# Patient Record
Sex: Female | Born: 2000 | Race: White | Hispanic: No | Marital: Single | Smoking: Never smoker
Health system: Southern US, Community
[De-identification: ages and names within clinical notes are randomized; demographics above are authoritative.]

## PROBLEM LIST (undated history)

## (undated) DIAGNOSIS — H209 Unspecified iridocyclitis: Secondary | ICD-10-CM

## (undated) DIAGNOSIS — H35371 Puckering of macula, right eye: Secondary | ICD-10-CM

## (undated) DIAGNOSIS — H4389 Other disorders of vitreous body: Secondary | ICD-10-CM

## (undated) DIAGNOSIS — H35059 Retinal neovascularization, unspecified, unspecified eye: Secondary | ICD-10-CM

## (undated) DIAGNOSIS — H35359 Cystoid macular degeneration, unspecified eye: Secondary | ICD-10-CM

## (undated) DIAGNOSIS — H33109 Unspecified retinoschisis, unspecified eye: Secondary | ICD-10-CM

---
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
## 2021-03-04 ENCOUNTER — Emergency Department (HOSPITAL_COMMUNITY): Payer: BC Managed Care – PPO

## 2021-03-04 ENCOUNTER — Other Ambulatory Visit: Payer: Self-pay

## 2021-03-04 ENCOUNTER — Emergency Department (HOSPITAL_COMMUNITY)
Admission: EM | Admit: 2021-03-04 | Discharge: 2021-03-05 | Disposition: A | Discharge status: Home / Self Care | Payer: BC Managed Care – PPO | Attending: Emergency Medicine | Admitting: Emergency Medicine

## 2021-03-04 ENCOUNTER — Encounter (HOSPITAL_COMMUNITY): Payer: Self-pay | Admitting: Emergency Medicine

## 2021-03-04 DIAGNOSIS — S8992XA Unspecified injury of left lower leg, initial encounter: Secondary | ICD-10-CM | POA: Diagnosis not present

## 2021-03-04 DIAGNOSIS — S3991XA Unspecified injury of abdomen, initial encounter: Secondary | ICD-10-CM | POA: Insufficient documentation

## 2021-03-04 DIAGNOSIS — S3992XA Unspecified injury of lower back, initial encounter: Secondary | ICD-10-CM | POA: Diagnosis not present

## 2021-03-04 DIAGNOSIS — T07XXXA Unspecified multiple injuries, initial encounter: Secondary | ICD-10-CM

## 2021-03-04 MED ORDER — HYDROCODONE-ACETAMINOPHEN 5-325 MG PO TABS
1.0000 | ORAL_TABLET | Freq: Once | ORAL | Status: AC
  Administered 2021-03-04: 23:00:00 1 via ORAL
  Filled 2021-03-04: qty 1
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
## 2021-03-05 ENCOUNTER — Emergency Department (HOSPITAL_COMMUNITY): Payer: BC Managed Care – PPO

## 2021-03-05 DIAGNOSIS — S3991XA Unspecified injury of abdomen, initial encounter: Secondary | ICD-10-CM | POA: Diagnosis not present

## 2021-03-05 LAB — PREGNANCY, URINE: Preg Test, Ur: NEGATIVE

## 2021-03-05 MED ORDER — IOHEXOL 300 MG/ML  SOLN
100.0000 mL | Freq: Once | INTRAMUSCULAR | Status: AC | PRN
  Administered 2021-03-05: 100 mL via INTRAVENOUS

## 2021-03-05 NOTE — Discharge Instructions (Signed)
Apply ice to sore areas for 30 minutes at a time, 4 times a day.  Take either ibuprofen or naproxen as needed for pain.  For additional pain relief, add acetaminophen.  Please note that combining acetaminophen and either ibuprofen or naproxen gives you better pain relief than taking either medication by itself.

## 2021-09-08 ENCOUNTER — Encounter: Payer: Self-pay | Admitting: Internal Medicine

## 2021-10-06 ENCOUNTER — Ambulatory Visit: Payer: BC Managed Care – PPO | Admitting: Gastroenterology

## 2021-10-21 ENCOUNTER — Ambulatory Visit: Payer: BC Managed Care – PPO | Admitting: Gastroenterology

## 2021-11-16 NOTE — Progress Notes (Deleted)
GI Office Note    Referring Provider: Donetta Potts, MD Primary Care Physician:  Donetta Potts, MD  Primary Gastroenterologist:  Chief Complaint   No chief complaint on file.    History of Present Illness   Emily Lewis is a 21 y.o. female presenting today at the request of Dr. Mayford Knife for further evaluation of weight loss.   Labs***        Medications   No current outpatient medications on file.   No current facility-administered medications for this visit.    Allergies   Allergies as of 11/17/2021 - Review Complete 05/24/2018  Allergen Reaction Noted   Zithromax [azithromycin] Hives 08/28/2015    Past Medical History   Past Medical History:  Diagnosis Date   CME (cystoid macular edema)    ERM OD (epiretinal membrane, right eye)    Retinal neovascularization    Retinoschisis    Uveitis    Vitritis     Past Surgical History   No past surgical history on file.  Past Family History   Family History  Problem Relation Age of Onset   Cancer Other    Hypertension Father    Other Father        MIASTERIA GRAVIS   Glaucoma Maternal Grandmother    Mental illness Maternal Grandfather    Other Maternal Grandfather        MIASTERIA GRAVIS    Past Social History   Social History   Socioeconomic History   Marital status: Single    Spouse name: Not on file   Number of children: Not on file   Years of education: Not on file   Highest education level: Not on file  Occupational History   Not on file  Tobacco Use   Smoking status: Never   Smokeless tobacco: Never  Vaping Use   Vaping Use: Never used  Substance and Sexual Activity   Alcohol use: Never   Drug use: Never   Sexual activity: Not on file  Other Topics Concern   Not on file  Social History Narrative   Not on file   Social Determinants of Health   Financial Resource Strain: Not on file  Food Insecurity: Not on file  Transportation Needs: Not on file   Physical Activity: Not on file  Stress: Not on file  Social Connections: Not on file  Intimate Partner Violence: Not on file    Review of Systems   General: Negative for anorexia, weight loss, fever, chills, fatigue, weakness. Eyes: Negative for vision changes.  ENT: Negative for hoarseness, difficulty swallowing , nasal congestion. CV: Negative for chest pain, angina, palpitations, dyspnea on exertion, peripheral edema.  Respiratory: Negative for dyspnea at rest, dyspnea on exertion, cough, sputum, wheezing.  GI: See history of present illness. GU:  Negative for dysuria, hematuria, urinary incontinence, urinary frequency, nocturnal urination.  MS: Negative for joint pain, low back pain.  Derm: Negative for rash or itching.  Neuro: Negative for weakness, abnormal sensation, seizure, frequent headaches, memory loss,  confusion.  Psych: Negative for anxiety, depression, suicidal ideation, hallucinations.  Endo: Negative for unusual weight change.  Heme: Negative for bruising or bleeding. Allergy: Negative for rash or hives.  Physical Exam   There were no vitals taken for this visit.   General: Well-nourished, well-developed in no acute distress.  Head: Normocephalic, atraumatic.   Eyes: Conjunctiva pink, no icterus. Mouth: Oropharyngeal mucosa moist and pink , no lesions erythema or exudate. Neck: Supple without  thyromegaly, masses, or lymphadenopathy.  Lungs: Clear to auscultation bilaterally.  Heart: Regular rate and rhythm, no murmurs rubs or gallops.  Abdomen: Bowel sounds are normal, nontender, nondistended, no hepatosplenomegaly or masses,  no abdominal bruits or hernia, no rebound or guarding.   Rectal: *** Extremities: No lower extremity edema. No clubbing or deformities.  Neuro: Alert and oriented x 4 , grossly normal neurologically.  Skin: Warm and dry, no rash or jaundice.   Psych: Alert and cooperative, normal mood and affect.  Labs   *** Imaging Studies    No results found.  Assessment       PLAN   ***   Leanna Battles. Melvyn Neth, MHS, PA-C [facility]

## 2021-11-17 ENCOUNTER — Ambulatory Visit: Payer: BC Managed Care – PPO | Admitting: Gastroenterology

## 2021-11-24 ENCOUNTER — Ambulatory Visit: Payer: BC Managed Care – PPO | Admitting: Registered"

## 2022-03-30 DIAGNOSIS — H3023 Posterior cyclitis, bilateral: Secondary | ICD-10-CM | POA: Diagnosis not present

## 2022-03-30 DIAGNOSIS — Z5181 Encounter for therapeutic drug level monitoring: Secondary | ICD-10-CM | POA: Diagnosis not present

## 2022-03-31 DIAGNOSIS — H35371 Puckering of macula, right eye: Secondary | ICD-10-CM | POA: Diagnosis not present

## 2022-03-31 DIAGNOSIS — H01009 Unspecified blepharitis unspecified eye, unspecified eyelid: Secondary | ICD-10-CM | POA: Diagnosis not present

## 2022-03-31 DIAGNOSIS — H169 Unspecified keratitis: Secondary | ICD-10-CM | POA: Diagnosis not present

## 2022-03-31 DIAGNOSIS — H35353 Cystoid macular degeneration, bilateral: Secondary | ICD-10-CM | POA: Diagnosis not present

## 2022-03-31 DIAGNOSIS — H3023 Posterior cyclitis, bilateral: Secondary | ICD-10-CM | POA: Diagnosis not present

## 2022-04-06 DIAGNOSIS — H01009 Unspecified blepharitis unspecified eye, unspecified eyelid: Secondary | ICD-10-CM | POA: Diagnosis not present

## 2022-04-21 DIAGNOSIS — H35353 Cystoid macular degeneration, bilateral: Secondary | ICD-10-CM | POA: Diagnosis not present

## 2022-04-21 DIAGNOSIS — H01009 Unspecified blepharitis unspecified eye, unspecified eyelid: Secondary | ICD-10-CM | POA: Diagnosis not present

## 2022-04-21 DIAGNOSIS — H35371 Puckering of macula, right eye: Secondary | ICD-10-CM | POA: Diagnosis not present

## 2022-04-21 DIAGNOSIS — H3023 Posterior cyclitis, bilateral: Secondary | ICD-10-CM | POA: Diagnosis not present

## 2022-04-21 DIAGNOSIS — H04123 Dry eye syndrome of bilateral lacrimal glands: Secondary | ICD-10-CM | POA: Diagnosis not present

## 2022-04-21 DIAGNOSIS — H169 Unspecified keratitis: Secondary | ICD-10-CM | POA: Diagnosis not present

## 2022-05-14 DIAGNOSIS — H169 Unspecified keratitis: Secondary | ICD-10-CM | POA: Diagnosis not present

## 2022-05-14 DIAGNOSIS — Z79899 Other long term (current) drug therapy: Secondary | ICD-10-CM | POA: Diagnosis not present

## 2022-05-14 DIAGNOSIS — H01009 Unspecified blepharitis unspecified eye, unspecified eyelid: Secondary | ICD-10-CM | POA: Diagnosis not present
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
